# Patient Record
Sex: Male | Born: 1990 | Race: White | Hispanic: No | Marital: Single | Smoking: Former smoker
Health system: Southern US, Community
[De-identification: ages and names within clinical notes are randomized; demographics above are authoritative.]

## PROBLEM LIST (undated history)

## (undated) DIAGNOSIS — F419 Anxiety disorder, unspecified: Secondary | ICD-10-CM

## (undated) DIAGNOSIS — A630 Anogenital (venereal) warts: Secondary | ICD-10-CM

## (undated) DIAGNOSIS — B019 Varicella without complication: Secondary | ICD-10-CM

## (undated) HISTORY — DX: Anxiety disorder, unspecified: F41.9

## (undated) HISTORY — DX: Anogenital (venereal) warts: A63.0

## (undated) HISTORY — DX: Varicella without complication: B01.9

---
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
## 2010-12-07 ENCOUNTER — Ambulatory Visit (INDEPENDENT_AMBULATORY_CARE_PROVIDER_SITE_OTHER): Payer: Self-pay | Admitting: Family Medicine

## 2010-12-07 ENCOUNTER — Encounter: Payer: Self-pay | Admitting: Family Medicine

## 2010-12-07 VITALS — BP 108/68 | Temp 97.9°F | Ht 71.25 in | Wt 134.0 lb

## 2010-12-07 DIAGNOSIS — F419 Anxiety disorder, unspecified: Secondary | ICD-10-CM

## 2010-12-07 DIAGNOSIS — F411 Generalized anxiety disorder: Secondary | ICD-10-CM

## 2010-12-07 MED ORDER — SERTRALINE HCL 50 MG PO TABS: 50.0000 mg | ORAL_TABLET | Freq: Every day | ORAL | Status: DC

## 2010-12-07 NOTE — Progress Notes (Signed)
  Subjective:    Patient ID: Jeffrey Levine, male    DOB: [birth year], 20 y.o.   MRN: [id number]  HPI Here asking for help with anxiety. This has bothered him for the past year, but it is getting worse. He feels stressed about life in general, though he denies any specific problems in his life right now. He worries about things and has trouble relaxing. His sleep is intact. He denies any depression symptoms. He was taking taking classes at [employer] but stopped. Now he is trying to get a job at [employer].  Review of Systems  Constitutional: Negative.   Respiratory: Negative.   Cardiovascular: Negative.   Psychiatric/Behavioral: Positive for agitation. Negative for suicidal ideas, hallucinations, behavioral problems, confusion, sleep disturbance, self-injury, dysphoric mood and decreased concentration. The patient is nervous/anxious. The patient is not hyperactive.        Objective:   Physical Exam  Constitutional: He is oriented to person, place, and time. He appears well-developed and well-nourished.  Neurological: He is alert and oriented to person, place, and time.  Psychiatric: He has a normal mood and affect. His behavior is normal. Judgment and thought content normal.          Assessment & Plan:  Try Zoloft. I advised getting moe exercise.

## 2010-12-11 ENCOUNTER — Ambulatory Visit: Payer: Self-pay | Admitting: Family Medicine

## 2011-01-08 ENCOUNTER — Ambulatory Visit (INDEPENDENT_AMBULATORY_CARE_PROVIDER_SITE_OTHER): Payer: Self-pay | Admitting: Family Medicine

## 2011-01-08 ENCOUNTER — Encounter: Payer: Self-pay | Admitting: Family Medicine

## 2011-01-08 VITALS — BP 100/60 | HR 50 | Wt 135.0 lb

## 2011-01-08 DIAGNOSIS — F419 Anxiety disorder, unspecified: Secondary | ICD-10-CM | POA: Insufficient documentation

## 2011-01-08 DIAGNOSIS — F411 Generalized anxiety disorder: Secondary | ICD-10-CM

## 2011-01-08 MED ORDER — LORAZEPAM 0.5 MG PO TABS: 0.5000 mg | ORAL_TABLET | Freq: Three times a day (TID) | ORAL | Status: DC | PRN

## 2011-01-08 NOTE — Progress Notes (Signed)
  Subjective:    Patient ID: Jeffrey Levine, male    DOB: November 01, 1990, 20 y.o.   MRN: [id number]  HPI Here to follow up on anxiety. He tried Zoloft for about 3 weeks but had to stop it due to upset stomach and insomnia. He asks now to try something that he does not have to take on a daily basis. He got a job at the [employer] and is very pleased with this.    Review of Systems  Constitutional: Negative.   Psychiatric/Behavioral: Negative for dysphoric mood and agitation. The patient is nervous/anxious.        Objective:   Physical Exam  Constitutional: He appears well-developed and well-nourished.  Psychiatric: He has a normal mood and affect. His behavior is normal. Thought content normal.          Assessment & Plan:  Try Lorazepam instead

## 2011-02-28 ENCOUNTER — Encounter: Payer: Self-pay | Admitting: Family Medicine

## 2011-02-28 ENCOUNTER — Ambulatory Visit (INDEPENDENT_AMBULATORY_CARE_PROVIDER_SITE_OTHER): Payer: Self-pay | Admitting: Family Medicine

## 2011-02-28 VITALS — BP 100/66 | HR 58 | Temp 98.7°F | Wt 132.0 lb

## 2011-02-28 DIAGNOSIS — F411 Generalized anxiety disorder: Secondary | ICD-10-CM

## 2011-02-28 MED ORDER — DIAZEPAM 5 MG PO TABS: 5.0000 mg | ORAL_TABLET | Freq: Three times a day (TID) | ORAL | Status: AC | PRN

## 2011-02-28 NOTE — Progress Notes (Signed)
  Subjective:    Patient ID: Jeffrey Levine, male    DOB: Jun 16, 1991, 20 y.o.   MRN: [id number]  HPI  Here to follow up on anxiety. He has been using Lorazepam and this has been helpful. He feels more relaxed with it, but he has to take 2 or 3 tablets at a time for it to help. Also it wears off quickly, often in only an hour or two.   Review of Systems  Constitutional: Negative.   Psychiatric/Behavioral: The patient is nervous/anxious.        Objective:   Physical Exam  Constitutional: He appears well-developed and well-nourished.  Psychiatric: He has a normal mood and affect. His behavior is normal. Judgment and thought content normal.          Assessment & Plan:  We will switch to Valium for better efficacy and for a longer duration of effects.

## 2011-05-01 LAB — CBC
Hemoglobin: 12.4
MCHC: 34.1
MCHC: 35
MCV: 88.5
MCV: 90.5
Platelets: 187
Platelets: 212
RBC: 4.03
WBC: 15.4 — ABNORMAL HIGH
WBC: 15.6 — ABNORMAL HIGH

## 2011-05-01 LAB — I-STAT 8, (EC8 V) (CONVERTED LAB)
Chloride: 105
Glucose, Bld: 98
HCT: 45
Operator id: 294341
Potassium: 3.4 — ABNORMAL LOW

## 2011-05-01 LAB — BASIC METABOLIC PANEL
BUN: 9
Calcium: 9
Creatinine, Ser: 0.67
Glucose, Bld: 128 — ABNORMAL HIGH

## 2011-05-01 LAB — POCT I-STAT CREATININE: Creatinine, Ser: 1

## 2011-05-01 LAB — SAMPLE TO BLOOD BANK

## 2011-05-01 LAB — PROTIME-INR
INR: 1.1
Prothrombin Time: 14

## 2011-05-07 ENCOUNTER — Encounter: Payer: Self-pay | Admitting: Family Medicine

## 2011-05-07 ENCOUNTER — Ambulatory Visit (INDEPENDENT_AMBULATORY_CARE_PROVIDER_SITE_OTHER): Payer: 59 | Admitting: Family Medicine

## 2011-05-07 VITALS — BP 108/70 | HR 74 | Temp 97.8°F | Wt 137.0 lb

## 2011-05-07 DIAGNOSIS — F411 Generalized anxiety disorder: Secondary | ICD-10-CM

## 2011-05-07 DIAGNOSIS — F419 Anxiety disorder, unspecified: Secondary | ICD-10-CM

## 2011-05-07 MED ORDER — DIAZEPAM 10 MG PO TABS: 10.0000 mg | ORAL_TABLET | Freq: Two times a day (BID) | ORAL | Status: AC | PRN

## 2011-05-07 NOTE — Progress Notes (Signed)
  Subjective:    Patient ID: Jeffrey Levine, male    DOB: [birth year], 20 y.o.   MRN: [id number]  HPI Here to follow up on anxiety. He definitely likes Valium over Ativan, and he says to works much better. However it does not last long. He has started to take 2 at a time, and this works quite well for him. He is more relaxed and focused at work.    Review of Systems  Constitutional: Negative.   Psychiatric/Behavioral: Negative for dysphoric mood and decreased concentration. The patient is nervous/anxious.        Objective:   Physical Exam  Constitutional: He appears well-developed and well-nourished.  Psychiatric: He has a normal mood and affect. His behavior is normal. Thought content normal.          Assessment & Plan:  Increase the Valium to 10 mg bid

## 2011-10-10 ENCOUNTER — Ambulatory Visit (INDEPENDENT_AMBULATORY_CARE_PROVIDER_SITE_OTHER): Payer: 59 | Admitting: Family Medicine

## 2011-10-10 ENCOUNTER — Encounter: Payer: Self-pay | Admitting: Family Medicine

## 2011-10-10 VITALS — BP 102/78 | HR 65 | Temp 97.7°F | Wt 144.0 lb

## 2011-10-10 DIAGNOSIS — F419 Anxiety disorder, unspecified: Secondary | ICD-10-CM

## 2011-10-10 DIAGNOSIS — F411 Generalized anxiety disorder: Secondary | ICD-10-CM

## 2011-10-10 MED ORDER — ESCITALOPRAM OXALATE 10 MG PO TABS: 10.0000 mg | ORAL_TABLET | Freq: Every day | ORAL | Status: AC

## 2011-10-10 MED ORDER — DIAZEPAM 10 MG PO TABS: 10.0000 mg | ORAL_TABLET | Freq: Two times a day (BID) | ORAL | Status: AC | PRN

## 2011-10-10 NOTE — Progress Notes (Signed)
  Subjective:    Patient ID: Jeffrey Levine, male    DOB: 1991-02-27, 21 y.o.   MRN: [id number]  HPI Here to follow up on anxiety. He has been taking Valium and is pleased with how it works, however he seems to crash in between doses and feels very anxious. He sleeps well.    Review of Systems  Constitutional: Negative.   Psychiatric/Behavioral: Negative for behavioral problems, confusion, dysphoric mood, decreased concentration and agitation. The patient is nervous/anxious.        Objective:   Physical Exam  Constitutional: He appears well-developed and well-nourished.  Psychiatric: He has a normal mood and affect. His behavior is normal. Thought content normal.          Assessment & Plan:  We will add Lexapro to the Valium. Recheck one month

## 2012-05-10 ENCOUNTER — Other Ambulatory Visit: Payer: Self-pay | Admitting: Family Medicine

## 2012-05-12 NOTE — Telephone Encounter (Signed)
Call in #[phone] with 5 rf 

## 2012-11-26 ENCOUNTER — Encounter: Payer: Self-pay | Admitting: Family Medicine

## 2012-11-26 ENCOUNTER — Ambulatory Visit (INDEPENDENT_AMBULATORY_CARE_PROVIDER_SITE_OTHER): Payer: BC Managed Care – PPO | Admitting: Family Medicine

## 2012-11-26 VITALS — BP 102/68 | HR 60 | Temp 98.1°F | Wt 141.0 lb

## 2012-11-26 DIAGNOSIS — K219 Gastro-esophageal reflux disease without esophagitis: Secondary | ICD-10-CM

## 2012-11-26 NOTE — Progress Notes (Signed)
  Subjective:    Patient ID: Jeffrey Levine, male    DOB: [birth year], 22 y.o.   MRN: [id number]  HPI Here with 2 months of frequent indigestion, belching, and epigastric pain. No trouble swallowing. No nausea. BMs are normal. This started about the time that he started using a lot of high protein drinks and shakes in an attempt to gain weight.    Review of Systems  Constitutional: Negative.   Respiratory: Negative.   Cardiovascular: Negative.   Gastrointestinal: Positive for abdominal pain and abdominal distention. Negative for nausea, vomiting, diarrhea, constipation and blood in stool.       Objective:   Physical Exam  Constitutional: He appears well-developed and well-nourished.  Cardiovascular: Normal rate, regular rhythm, normal heart sounds and intact distal pulses.   Pulmonary/Chest: Effort normal and breath sounds normal.  Abdominal: Soft. Bowel sounds are normal. He exhibits no distension and no mass. There is no tenderness. There is no rebound and no guarding.          Assessment & Plan:  This is GERD which is probably related to his high protein intake. Advised to cut down on the drinks and shakes. Try Prilosec OTC daily

## 2013-04-30 ENCOUNTER — Encounter: Payer: Self-pay | Admitting: Family Medicine

## 2013-04-30 ENCOUNTER — Ambulatory Visit (INDEPENDENT_AMBULATORY_CARE_PROVIDER_SITE_OTHER): Payer: BC Managed Care – PPO | Admitting: Family Medicine

## 2013-04-30 VITALS — BP 108/70 | HR 77 | Temp 98.3°F | Wt 140.0 lb

## 2013-04-30 DIAGNOSIS — B977 Papillomavirus as the cause of diseases classified elsewhere: Secondary | ICD-10-CM

## 2013-04-30 DIAGNOSIS — Z23 Encounter for immunization: Secondary | ICD-10-CM

## 2013-04-30 MED ORDER — IMIQUIMOD 5 % EX CREA: TOPICAL_CREAM | CUTANEOUS | Status: DC

## 2013-04-30 NOTE — Progress Notes (Signed)
  Subjective:    Patient ID: Jeffrey Levine, male    DOB: [birth year], 22 y.o.   MRN: [id number]  HPI Patient here to discuss treatment for genital warts. These were diagnosed sometime in the past. He has previously used podofilox but had excessive drying and this never seemed to eradicate his warts. He has not tried any other topicals. He denies any other history of STD. He is also requesting HPV vaccine that we explained this is more for prevention and not treatment He is not currently sexually active.  Past Medical History  Diagnosis Date  . Chicken pox   . Anxiety    No past surgical history on file.  reports that he has been smoking Cigarettes.  He has been smoking about 0.00 packs per day. He has never used smokeless tobacco. He reports that he drinks alcohol. He reports that he does not use illicit drugs. family history includes Alcohol abuse in his other; Arthritis in his other; Diabetes in his other; Hypertension in his other; Stroke in his other. No Known Allergies    Review of Systems  Constitutional: Negative for fever and chills.  Genitourinary: Negative for dysuria, discharge and penile pain.  Hematological: Negative for adenopathy.       Objective:   Physical Exam  Constitutional: He appears well-developed and well-nourished.  Cardiovascular: Normal rate and regular rhythm.   Skin:  Patient has some typical condylomatous lesions scattered on the shaft of the penis. He has about 7 altogether in number          Assessment & Plan:  Genital condyloma. Discussed options. We'll try Aldara. Reviewed possible side effects and proper use. He is aware condyloma are difficult to eradicate.  Patient requesting HPV vaccine. We again reiterated this is not for treatment but instead for prevention. After reviewing risks benefits he still prefers going ahead with treatment.

## 2013-04-30 NOTE — Patient Instructions (Addendum)
Genital Warts Genital warts are a sexually transmitted infection. They may appear as small bumps on the tissues of the genital area. CAUSES  Genital warts are caused by a virus called human papillomavirus (HPV). HPV is the most common sexually transmitted disease (STD) and infection of the sex organs. This infection is spread by having unprotected sex with an infected person. It can be spread by vaginal, anal, and oral sex. Many people do not know they are infected. They may be infected for years without problems. However, even if they do not have problems, they can unknowingly pass the infection to their sexual partners. SYMPTOMS   Itching and irritation in the genital area.  Warts that bleed.  Painful sexual intercourse. DIAGNOSIS  Warts are usually recognized with the naked eye on the vagina, vulva, perineum, anus, and rectum. Certain tests can also diagnose genital warts, such as:  A Pap test.  A tissue sample (biopsy) exam.  Colposcopy. A magnifying tool is used to examine the vagina and cervix. The HPV cells will change color when certain solutions are used. TREATMENT  Warts can be removed by:  Applying certain chemicals, such as cantharidin or podophyllin.  Liquid nitrogen freezing (cryotherapy).  Immunotherapy with candida or trichophyton injections.  Laser treatment.  Burning with an electrified probe (electrocautery).  Interferon injections.  Surgery. PREVENTION  HPV vaccination can help prevent HPV infections that cause genital warts and that cause cancer of the cervix. It is recommended that the vaccination be given to people between the ages 9 to 26 years old. The vaccine might not work as well or might not work at all if you already have HPV. It should not be given to pregnant women. HOME CARE INSTRUCTIONS   It is important to follow your caregiver's instructions. The warts will not go away without treatment. Repeat treatments are often needed to get rid of warts.  Even after it appears that the warts are gone, the normal tissue underneath often remains infected.  Do not try to treat genital warts with medicine used to treat hand warts. This type of medicine is strong and can burn the skin in the genital area, causing more damage.  Tell your past and current sexual partner(s) that you have genital warts. They may be infected also and need treatment.  Avoid sexual contact while being treated.  Do not touch or scratch the warts. The infection may spread to other parts of your body.  Women with genital warts should have a cervical cancer check (Pap test) at least once a year. This type of cancer is slow-growing and can be cured if found early. Chances of developing cervical cancer are increased with HPV.  Inform your obstetrician about your warts in the event of pregnancy. This virus can be passed to the baby's respiratory tract. Discuss this with your caregiver.  Use a condom during sexual intercourse. Following treatment, the use of condoms will help prevent reinfection.  Ask your caregiver about using over-the-counter anti-itch creams. SEEK MEDICAL CARE IF:   Your treated skin becomes red, swollen, or painful.  You have a fever.  You feel generally ill.  You feel little lumps in and around your genital area.  You are bleeding or have painful sexual intercourse. MAKE SURE YOU:   Understand these instructions.  Will watch your condition.  Will get help right away if you are not doing well or get worse. Document Released: 07/06/2000 Document Revised: 10/01/2011 Document Reviewed: 01/15/2011 Exit[employer] Patient Information 2014 [employer].    Do not use Aldara beyond 16 weeks continuous use.

## 2013-07-24 ENCOUNTER — Encounter: Payer: Self-pay | Admitting: Family Medicine

## 2013-07-24 ENCOUNTER — Ambulatory Visit (INDEPENDENT_AMBULATORY_CARE_PROVIDER_SITE_OTHER): Payer: No Typology Code available for payment source | Admitting: Family Medicine

## 2013-07-24 VITALS — BP 100/66 | HR 66 | Temp 98.1°F | Wt 145.0 lb

## 2013-07-24 DIAGNOSIS — A630 Anogenital (venereal) warts: Secondary | ICD-10-CM

## 2013-07-24 NOTE — Progress Notes (Signed)
   Subjective:    Patient ID: Jeffrey Levine, male    DOB: [birth year], 23 y.o.   MRN: [id number]  HPI Here to treat genital warts that have been present for about one year. He had seen other doctors and had tried Condylox and Aldara creams. These were not successful and he stopped them due to intense irritation.    Review of Systems  Constitutional: Negative.        Objective:   Physical Exam  Constitutional: He appears well-developed.  Genitourinary:  Multiple flat or papular warts along the mons pubis and the penile shaft          Assessment & Plan:  A total of 15-20 warts were treated with cryosurgery. He will cover the area with Vaseline for the next week until the blisters subside. Recheck prn

## 2013-07-24 NOTE — Progress Notes (Signed)
Pre visit review using our clinic review tool, if applicable. No additional management support is needed unless otherwise documented below in the visit note. 

## 2013-07-27 DIAGNOSIS — A630 Anogenital (venereal) warts: Secondary | ICD-10-CM | POA: Insufficient documentation

## 2015-11-25 ENCOUNTER — Encounter: Payer: Self-pay | Admitting: Family Medicine

## 2015-11-25 ENCOUNTER — Ambulatory Visit (INDEPENDENT_AMBULATORY_CARE_PROVIDER_SITE_OTHER): Payer: 59 | Admitting: Family Medicine

## 2015-11-25 VITALS — BP 101/65 | HR 50 | Temp 98.3°F | Ht 71.25 in | Wt 157.0 lb

## 2015-11-25 DIAGNOSIS — S39012A Strain of muscle, fascia and tendon of lower back, initial encounter: Secondary | ICD-10-CM

## 2015-11-25 MED ORDER — DICLOFENAC SODIUM 75 MG PO TBEC: 75.0000 mg | DELAYED_RELEASE_TABLET | Freq: Two times a day (BID) | ORAL | Status: DC | PRN

## 2015-11-25 MED ORDER — CYCLOBENZAPRINE HCL 10 MG PO TABS: 10.0000 mg | ORAL_TABLET | Freq: Three times a day (TID) | ORAL | Status: DC | PRN

## 2015-11-25 NOTE — Progress Notes (Signed)
Pre visit review using our clinic review tool, if applicable. No additional management support is needed unless otherwise documented below in the visit note. 
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
## 2017-08-19 ENCOUNTER — Encounter: Payer: Self-pay | Admitting: Family Medicine

## 2017-08-19 ENCOUNTER — Ambulatory Visit (INDEPENDENT_AMBULATORY_CARE_PROVIDER_SITE_OTHER): Payer: 59 | Admitting: Family Medicine

## 2017-08-19 VITALS — BP 120/80 | HR 83 | Temp 98.0°F | Wt 152.8 lb

## 2017-08-19 DIAGNOSIS — B079 Viral wart, unspecified: Secondary | ICD-10-CM

## 2017-08-19 NOTE — Progress Notes (Signed)
   Subjective:    Patient ID: Jeffrey Levine, male    DOB: [birth year], 27 y.o.   MRN: [id number]  HPI Here to treat some warts that have been present for several years. He has a few small ones in the pubic area and a cluster on larger ones on the left 4th finger.    Review of Systems  Constitutional: Negative.   Respiratory: Negative.   Cardiovascular: Negative.        Objective:   Physical Exam  Constitutional: He appears well-developed and well-nourished.  Cardiovascular: Normal rate, regular rhythm, normal heart sounds and intact distal pulses.  Pulmonary/Chest: Effort normal and breath sounds normal. No respiratory distress. He has no wheezes. He has no rales.  Skin:  3 large warts on the left 4th finger. 3 smaller warts on the pubis.           Assessment & Plan:  Warts, all these were treated with several cycles of cryotherapy. Recheck prn. Gershon CraneStephen Aadyn Buchheit, MD

## 2017-12-06 ENCOUNTER — Ambulatory Visit: Payer: 59 | Admitting: Adult Health

## 2017-12-06 ENCOUNTER — Encounter: Payer: Self-pay | Admitting: Adult Health

## 2017-12-06 VITALS — BP 106/70 | Temp 98.4°F | Wt 159.0 lb

## 2017-12-06 DIAGNOSIS — N50812 Left testicular pain: Secondary | ICD-10-CM

## 2017-12-06 NOTE — Progress Notes (Signed)
Subjective:    Patient ID: Jeffrey Levine, male    DOB: 1991/02/20, 27 y.o.   MRN: [id number]  HPI  27 year old male who  has a past medical history of Anxiety, Chicken pox, and Genital warts. He is a patient of Dr. Clent Ridges who I am seeing today for an acute issue of left testicle pain x 2-3 days. Pain was worse yesterday and has started to resolve today. Pain worse when sitting.   He has not noticed any masses. Denies any trauma or concern for STI.   Review of Systems See HPI   Past Medical History:  Diagnosis Date  . Anxiety   . Chicken pox   . Genital warts     Social History   Socioeconomic History  . Marital status: Single    Spouse name: Not on file  . Number of children: Not on file  . Years of education: Not on file  . Highest education level: Not on file  Occupational History  . Not on file  Social Needs  . Financial resource strain: Not on file  . Food insecurity:    Worry: Not on file    Inability: Not on file  . Transportation needs:    Medical: Not on file    Non-medical: Not on file  Tobacco Use  . Smoking status: Former Smoker    Types: Cigarettes    Last attempt to quit: 04/23/2013    Years since quitting: 4.6  . Smokeless tobacco: Never Used  . Tobacco comment: 6 cigarettes each day  Substance and Sexual Activity  . Alcohol use: Yes    Alcohol/week: 0.0 oz    Comment: beers on the weekend  . Drug use: No  . Sexual activity: Not on file  Lifestyle  . Physical activity:    Days per week: Not on file    Minutes per session: Not on file  . Stress: Not on file  Relationships  . Social connections:    Talks on phone: Not on file    Gets together: Not on file    Attends religious service: Not on file    Active member of club or organization: Not on file    Attends meetings of clubs or organizations: Not on file    Relationship status: Not on file  . Intimate partner violence:    Fear of current or ex partner: Not on file    Emotionally  abused: Not on file    Physically abused: Not on file    Forced sexual activity: Not on file  Other Topics Concern  . Not on file  Social History Narrative  . Not on file    History reviewed. No pertinent surgical history.  Family History  Problem Relation Age of Onset  . Alcohol abuse Other   . Arthritis Other   . Diabetes Other   . Hypertension Other   . Stroke Other     No Known Allergies  No current outpatient medications on file prior to visit.   No current facility-administered medications on file prior to visit.     BP 106/70   Temp 98.4 F (36.9 C) (Oral)   Wt 159 lb (72.1 kg)   BMI 22.02 kg/m       Objective:   Physical Exam  Constitutional: He appears well-developed and well-nourished.  Abdominal: Hernia confirmed negative in the left inguinal area.  Genitourinary: Penis normal. Right testis shows no mass, no swelling and no tenderness. Right testis  is descended. Cremasteric reflex is not absent on the right side. Left testis shows tenderness. Left testis shows no mass and no swelling. Left testis is descended. Cremasteric reflex is not absent on the left side.  Genitourinary Comments: Variocele present    Lymphadenopathy: No inguinal adenopathy noted on the left side.  Skin: He is not diaphoretic.  Nursing note and vitals reviewed.     Assessment & Plan:  1. Pain in left testicle - Variocele noted.  - Will get [place] due to age of patient  - No torsion or mass noted.  - Advised motrin or tylenol for symptom relief.  - Return precautions given   Shirline Frees, NP

## 2018-01-01 ENCOUNTER — Telehealth: Payer: Self-pay | Admitting: *Deleted

## 2018-01-01 ENCOUNTER — Other Ambulatory Visit: Payer: Self-pay | Admitting: Adult Health

## 2018-01-01 DIAGNOSIS — N50819 Testicular pain, unspecified: Secondary | ICD-10-CM

## 2018-01-01 NOTE — Telephone Encounter (Signed)
Order changed.

## 2018-01-01 NOTE — Telephone Encounter (Signed)
Copied from CRM [id number]. Topic: Quick Communication - See Telephone Encounter >> Jan 01, 2018 11:36 AM Waymon AmatoBurton, Donna F wrote: [facility] is calling to get a different order for pt scrotum ultrasound the order needs to say ultrasound with doppler   [facility] [phone]

## 2018-01-01 NOTE — Telephone Encounter (Signed)
Sent to PCP. Please advise.  

## 2018-01-01 NOTE — Telephone Encounter (Signed)
Since Jeffrey Levine wrote the original order and examined him. I will let [name] change the order

## 2018-01-02 ENCOUNTER — Other Ambulatory Visit: Payer: Self-pay | Admitting: Family Medicine

## 2018-01-02 DIAGNOSIS — N5082 Scrotal pain: Secondary | ICD-10-CM

## 2018-01-02 NOTE — Telephone Encounter (Signed)
I just need it for pain.   Are the orders I put in yesterday not correct?

## 2018-01-02 NOTE — Telephone Encounter (Signed)
Spoke to [place] and informed her that [name] has changed order.  She states it is still incorrect.  I changed it to [id number].  No further action required.

## 2018-01-02 NOTE — Telephone Encounter (Signed)
If trying to rule out torsion, needs to be orders stat and done at hospital. If not needs to be US scotum with doppler if just for pain. Please advise

## 2018-04-11 ENCOUNTER — Ambulatory Visit: Payer: 59 | Admitting: Family Medicine

## 2018-04-11 ENCOUNTER — Encounter: Payer: Self-pay | Admitting: Family Medicine

## 2018-04-11 VITALS — BP 110/84 | HR 78 | Temp 98.4°F | Wt 154.1 lb

## 2018-04-11 DIAGNOSIS — J019 Acute sinusitis, unspecified: Secondary | ICD-10-CM

## 2018-04-11 MED ORDER — AZITHROMYCIN 250 MG PO TABS: ORAL_TABLET | ORAL | 0 refills | Status: DC

## 2018-04-11 NOTE — Progress Notes (Signed)
   Subjective:    Patient ID: Jeffrey Levine, male    DOB: [birth year], 27 y.o.   MRN: [id number]  HPI Here for 5 days of sinus pressure, PND, ST, and a dry cough. On Mucinex.    Review of Systems  Constitutional: Negative.   HENT: Positive for congestion, postnasal drip, sinus pressure and sore throat. Negative for sinus pain.   Eyes: Negative.   Respiratory: Positive for cough.        Objective:   Physical Exam  Constitutional: He appears well-developed and well-nourished.  HENT:  Right Ear: External ear normal.  Left Ear: External ear normal.  Nose: Nose normal.  Mouth/Throat: Oropharynx is clear and moist.  Eyes: Conjunctivae are normal.  Neck: No thyromegaly present.  Pulmonary/Chest: Effort normal and breath sounds normal. No stridor. No respiratory distress. He has no wheezes. He has no rales.  Lymphadenopathy:    He has no cervical adenopathy.          Assessment & Plan:  Sinusitis, treat with a Zpack.  Gershon CraneStephen Aliannah Holstrom, MD

## 2018-04-16 ENCOUNTER — Ambulatory Visit: Payer: 59 | Admitting: Family Medicine

## 2018-04-16 ENCOUNTER — Encounter: Payer: Self-pay | Admitting: Family Medicine

## 2018-04-16 VITALS — BP 110/72 | HR 58 | Temp 97.7°F | Wt 155.2 lb

## 2018-04-16 DIAGNOSIS — L409 Psoriasis, unspecified: Secondary | ICD-10-CM | POA: Diagnosis not present

## 2018-04-16 DIAGNOSIS — A630 Anogenital (venereal) warts: Secondary | ICD-10-CM

## 2018-04-16 DIAGNOSIS — Z209 Contact with and (suspected) exposure to unspecified communicable disease: Secondary | ICD-10-CM

## 2018-04-16 MED ORDER — IMIQUIMOD 5 % EX CREA: TOPICAL_CREAM | CUTANEOUS | 5 refills | Status: DC

## 2018-04-16 MED ORDER — HALOBETASOL PROPIONATE 0.05 % EX CREA: TOPICAL_CREAM | Freq: Two times a day (BID) | CUTANEOUS | 5 refills | Status: DC | PRN

## 2018-04-16 NOTE — Progress Notes (Signed)
   Subjective:    Patient ID: Jeffrey Levine, male    DOB: [birth year], 27 y.o.   MRN: [id number]  HPI Here for several issues. First he thinks some genital warts have come back on his scrotum. We had treated a few of these with cryotherapy earlier this year. Also his psoriasis has gotten a little worse, and the Triamcinolone cream has not been very effective.    Review of Systems  Constitutional: Negative.   Respiratory: Negative.   Cardiovascular: Negative.   Skin: Positive for rash.       Objective:   Physical Exam  Constitutional: He appears well-developed and well-nourished.  Cardiovascular: Normal rate, regular rhythm, normal heart sounds and intact distal pulses.  Pulmonary/Chest: Effort normal and breath sounds normal.  Genitourinary:  Genitourinary Comments: There are 3 pink tiny papular lesions on the scrotum  Skin:  There are thick scaly plaques over the extensor surfaces of both elbows           Assessment & Plan:  Recurrent genital warts, he will try Aldara cream as needed. Treat the psoriasis with Halobetasol cream. We will do some additional STD testing today. Gershon Crane, MD

## 2018-04-17 LAB — C. TRACHOMATIS/N. GONORRHOEAE RNA
C. TRACHOMATIS RNA, TMA: NOT DETECTED
N. GONORRHOEAE RNA, TMA: NOT DETECTED

## 2018-04-17 LAB — HIV ANTIBODY (ROUTINE TESTING W REFLEX): HIV 1&2 Ab, 4th Generation: NONREACTIVE

## 2018-04-17 LAB — RPR: RPR: NONREACTIVE

## 2018-09-11 ENCOUNTER — Telehealth: Payer: Self-pay

## 2018-09-11 NOTE — Telephone Encounter (Signed)
Copied from CRM [id number]. Topic: General - Other >> Sep 11, 2018  2:08 PM [name] wrote:  Pt Mom has shingles and is asking if he need to get the shingles vaccine

## 2018-09-12 NOTE — Telephone Encounter (Signed)
Attempted to call the pt but the VM is not set up.  Will call back later 

## 2018-09-12 NOTE — Telephone Encounter (Signed)
Dr. Fry please advise. Thanks  

## 2018-09-12 NOTE — Telephone Encounter (Signed)
No it is not recommended until the age of 36

## 2018-09-16 NOTE — Telephone Encounter (Signed)
Attempted to call the pt again but the VM is not set up.

## 2018-09-18 NOTE — Telephone Encounter (Signed)
Called and spoke with pt and he stated that he has never had the chicken pox and he stated that her doctor told him that he was at a very high risk of contracting it.  He would like to know what he needs to do about getting the shingles vaccine.

## 2018-09-19 NOTE — Telephone Encounter (Signed)
I would wait until he is 50 because (1) insurance probably will not cover it, (2) we do not know how safe it is at his age, and (3) we do not know how long the immunity would last if we give it too early

## 2018-10-06 ENCOUNTER — Ambulatory Visit: Payer: 59 | Admitting: Family Medicine

## 2018-10-10 ENCOUNTER — Ambulatory Visit: Payer: 59 | Admitting: Family Medicine

## 2018-12-30 ENCOUNTER — Telehealth: Payer: Self-pay | Admitting: *Deleted

## 2018-12-30 ENCOUNTER — Ambulatory Visit (INDEPENDENT_AMBULATORY_CARE_PROVIDER_SITE_OTHER): Payer: 59 | Admitting: Family Medicine

## 2018-12-30 ENCOUNTER — Other Ambulatory Visit: Payer: Self-pay

## 2018-12-30 ENCOUNTER — Encounter: Payer: Self-pay | Admitting: Family Medicine

## 2018-12-30 DIAGNOSIS — B349 Viral infection, unspecified: Secondary | ICD-10-CM | POA: Diagnosis not present

## 2018-12-30 DIAGNOSIS — Z20828 Contact with and (suspected) exposure to other viral communicable diseases: Secondary | ICD-10-CM | POA: Diagnosis not present

## 2018-12-30 NOTE — Progress Notes (Signed)
   Subjective:    Patient ID: Jeffrey Levine, male    DOB: [birth year], 28 y.o.   MRN: [id number]  HPI Virtual Visit via Video Note  I connected with the patient on 12/30/18 at  3:15 PM EDT by a video enabled telemedicine application and verified that I am speaking with the correct person using two identifiers.  Location patient: home Location provider:work or home office Persons participating in the virtual visit: patient, provider  I discussed the limitations of evaluation and management by telemedicine and the availability of in person appointments. The patient expressed understanding and agreed to proceed.   HPI: Here for 4 days of fever to 100.7 degrees, body aches, headache, ST, and diarrhea. No cough or SOB, no nausea or vomiting. hehas been drinking fluids and taking Nyquil or Dayquil. He went to the [facility] yesterday and he was tested for the Covid-19 virus. He is still waiting on results. He has been quarantining himself at home for several days.    ROS: See pertinent positives and negatives per HPI.  Past Medical History:  Diagnosis Date  . Anxiety   . Chicken pox   . Genital warts     History reviewed. No pertinent surgical history.  Family History  Problem Relation Age of Onset  . Alcohol abuse Other   . Arthritis Other   . Diabetes Other   . Hypertension Other   . Stroke Other      Current Outpatient Medications:  .  halobetasol (ULTRAVATE) 0.05 % cream, Apply topically 2 (two) times daily as needed. For psoriasis, Disp: 50 g, Rfl: 5 .  imiquimod (ALDARA) 5 % cream, Apply topically 3 (three) times a week., Disp: 24 each, Rfl: 5  EXAM:  VITALS per patient if applicable:  GENERAL: alert, oriented, appears well and in no acute distress  HEENT: atraumatic, conjunttiva clear, no obvious abnormalities on inspection of external nose and ears  NECK: normal movements of the head and neck  LUNGS: on inspection no signs of respiratory  distress, breathing rate appears normal, no obvious gross SOB, gasping or wheezing  CV: no obvious cyanosis  MS: moves all visible extremities without noticeable abnormality  PSYCH/NEURO: pleasant and cooperative, no obvious depression or anxiety, speech and thought processing grossly intact  ASSESSMENT AND PLAN: Viral illness. We will find out of this is from a Covid-19 infection or not. He will rest and drink fluids. He will stay home on quarantine. Try Tylenol for aches and fever.  Alysia Penna, MD  Discussed the following assessment and plan:  No diagnosis found.     I discussed the assessment and treatment plan with the patient. The patient was provided an opportunity to ask questions and all were answered. The patient agreed with the plan and demonstrated an understanding of the instructions.   The patient was advised to call back or seek an in-person evaluation if the symptoms worsen or if the condition fails to improve as anticipated.     Review of Systems     Objective:   Physical Exam        Assessment & Plan:

## 2018-12-30 NOTE — Telephone Encounter (Signed)
Dr. Sarajane Jews please advise if you want to wait for covid test to come back or do a VV with the pt?  Thanks

## 2018-12-30 NOTE — Telephone Encounter (Signed)
Patient called after hours line on 12/29/2018. Patient c/o fever of 100. Sore throat, congested, no smell or taste. Was tested for COVID, no results yet. Clinic RN does not see any pending results for COVID-19.

## 2018-12-30 NOTE — Telephone Encounter (Signed)
We can do a Doxy visit this afternoon

## 2018-12-30 NOTE — Telephone Encounter (Signed)
Doxy scheduled for today. 

## 2020-04-26 ENCOUNTER — Other Ambulatory Visit: Payer: Self-pay

## 2020-04-26 ENCOUNTER — Encounter: Payer: Self-pay | Admitting: Family Medicine

## 2020-04-26 ENCOUNTER — Ambulatory Visit (INDEPENDENT_AMBULATORY_CARE_PROVIDER_SITE_OTHER): Payer: Self-pay | Admitting: Family Medicine

## 2020-04-26 VITALS — BP 110/80 | HR 75 | Temp 98.8°F | Ht 72.0 in | Wt 154.8 lb

## 2020-04-26 DIAGNOSIS — R109 Unspecified abdominal pain: Secondary | ICD-10-CM

## 2020-04-26 DIAGNOSIS — N390 Urinary tract infection, site not specified: Secondary | ICD-10-CM

## 2020-04-26 MED ORDER — HALOBETASOL PROPIONATE 0.05 % EX CREA: TOPICAL_CREAM | Freq: Two times a day (BID) | CUTANEOUS | 5 refills | Status: DC | PRN

## 2020-04-26 MED ORDER — CIPROFLOXACIN HCL 500 MG PO TABS: 500.0000 mg | ORAL_TABLET | Freq: Two times a day (BID) | ORAL | 0 refills | Status: DC

## 2020-04-26 NOTE — Progress Notes (Signed)
   Subjective:    Patient ID: Jeffrey Levine, male    DOB: 08/06/1990, 29 y.o.   MRN: [id number]  HPI Here for 2 weeks of an intermittent sharp pain in the left flank. No recent trauma, but prior to the pain starting he had been working out at home doing sit ups, push ups, and planks. The pain was worst last week and now it is fading away. It has pain when he takes a deep breath, but he is fine when sitting still. No bowel problems. No fever or urinary urgency or burning. He does notice a "stong smell" to his urine at times.   Review of Systems  Constitutional: Negative.   Respiratory: Negative.   Cardiovascular: Negative.   Gastrointestinal: Negative.   Genitourinary: Positive for flank pain. Negative for dysuria, hematuria, testicular pain and urgency.       Objective:   Physical Exam Constitutional:      General: He is not in acute distress.    Appearance: Normal appearance.  Cardiovascular:     Rate and Rhythm: Normal rate and regular rhythm.     Pulses: Normal pulses.     Heart sounds: Normal heart sounds.  Pulmonary:     Effort: Pulmonary effort is normal.     Breath sounds: Normal breath sounds.     Comments: He is tender over the left lateral ribs near the inferior rib margin Abdominal:     General: Abdomen is flat. Bowel sounds are normal. There is no distension.     Palpations: Abdomen is soft. There is no mass.     Tenderness: There is no abdominal tenderness. There is no guarding or rebound.     Hernia: No hernia is present.  Neurological:     Mental Status: He is alert.           Assessment & Plan:  UTI, treat with Cipro for 10 days. He will drink plenty of water. Culture the sample.  Gershon Crane, MD

## 2020-04-27 ENCOUNTER — Ambulatory Visit: Payer: 59 | Admitting: Family Medicine

## 2020-04-27 NOTE — Addendum Note (Signed)
Addended by: Lerry Liner on: 04/27/2020 02:02 PM   Modules accepted: Orders

## 2020-04-28 LAB — URINE CULTURE
MICRO NUMBER:: 11039105
Result:: NO GROWTH
SPECIMEN QUALITY:: ADEQUATE

## 2020-05-02 ENCOUNTER — Telehealth: Payer: Self-pay

## 2020-05-02 NOTE — Telephone Encounter (Signed)
Since he has ruled out for a kidney infection ,it now seems likely this is a muscular pain as we discussed. Call in Ibuprofen 800 mg to take one every 6 hours prn pain, #60 with no refills. If this does not work he should come back in for another exam.

## 2020-05-02 NOTE — Telephone Encounter (Signed)
Patient called and advised of Dr. Claris Che recommendation below and to stop taking the Cipro. He says he doesn't want the Ibuprofen called in that if he doesn't get better, he will call in for another visit.

## 2020-05-02 NOTE — Telephone Encounter (Signed)
Patient called asking for his urine culture results. Given results negative for growth as noted by Dr. Clent Ridges on 04/29/20. He says he is still not feeling any better on day 5 of the antibiotics and wonders if something else is going on. I advised that I will let Dr. Clent Ridges know and will call back with his recommendation.

## 2020-05-06 NOTE — Telephone Encounter (Signed)
He should drink lots and lots of water and maybe add some cranberry juice to it

## 2020-05-06 NOTE — Telephone Encounter (Signed)
Patient called to ask Dr. Clent Ridges why his urine still has an odor and should he be seen again or what should he be doing or not doing to help with the odor of his urine.  Please advise

## 2020-05-09 NOTE — Telephone Encounter (Signed)
Patient called and given recommendations per Dr. Clent Ridges. He verbalized understanding. He says he drinks about 2-3 bottles/day of water, nothing else. I asked about the urine color, he says golden yellow. I advised to drink 64 oz water/day and call back in a few days if no changes to the urine. He verbalized understanding.

## 2021-01-03 ENCOUNTER — Ambulatory Visit: Payer: Self-pay | Admitting: Family Medicine

## 2021-01-03 ENCOUNTER — Other Ambulatory Visit: Payer: Self-pay

## 2021-01-04 ENCOUNTER — Ambulatory Visit: Payer: Self-pay | Admitting: Family Medicine

## 2021-01-05 ENCOUNTER — Encounter: Payer: Self-pay | Admitting: Family Medicine

## 2021-01-05 ENCOUNTER — Other Ambulatory Visit: Payer: Self-pay

## 2021-01-05 ENCOUNTER — Ambulatory Visit (INDEPENDENT_AMBULATORY_CARE_PROVIDER_SITE_OTHER): Payer: 59 | Admitting: Family Medicine

## 2021-01-05 VITALS — BP 108/78 | HR 68 | Temp 97.7°F | Wt 147.0 lb

## 2021-01-05 DIAGNOSIS — R8290 Unspecified abnormal findings in urine: Secondary | ICD-10-CM | POA: Diagnosis not present

## 2021-01-05 DIAGNOSIS — J3089 Other allergic rhinitis: Secondary | ICD-10-CM | POA: Diagnosis not present

## 2021-01-05 DIAGNOSIS — G629 Polyneuropathy, unspecified: Secondary | ICD-10-CM

## 2021-01-05 DIAGNOSIS — Z209 Contact with and (suspected) exposure to unspecified communicable disease: Secondary | ICD-10-CM | POA: Diagnosis not present

## 2021-01-05 LAB — LIPID PANEL
Cholesterol: 108 mg/dL (ref 0–200)
HDL: 55.9 mg/dL (ref >39.00)
LDL Cholesterol: 37 mg/dL (ref 0–99)
NonHDL: 52.26
Total CHOL/HDL Ratio: 2
Triglycerides: 74 mg/dL (ref 0.0–149.0)
VLDL: 14.8 mg/dL (ref 0.0–40.0)

## 2021-01-05 LAB — HEPATIC FUNCTION PANEL
ALT: 16 U/L (ref 0–53)
AST: 21 U/L (ref 0–37)
Albumin: 5.1 g/dL (ref 3.5–5.2)
Alkaline Phosphatase: 91 U/L (ref 39–117)
Bilirubin, Direct: 0.2 mg/dL (ref 0.0–0.3)
Total Bilirubin: 0.8 mg/dL (ref 0.2–1.2)
Total Protein: 7.9 g/dL (ref 6.0–8.3)

## 2021-01-05 LAB — BASIC METABOLIC PANEL
BUN: 10 mg/dL (ref 6–23)
CO2: 31 mEq/L (ref 19–32)
Calcium: 10.3 mg/dL (ref 8.4–10.5)
Chloride: 99 mEq/L (ref 96–112)
Creatinine, Ser: 0.79 mg/dL (ref 0.40–1.50)
GFR: 119.35 mL/min (ref >60.00)
Glucose, Bld: 76 mg/dL (ref 70–99)
Potassium: 4.3 mEq/L (ref 3.5–5.1)
Sodium: 136 mEq/L (ref 135–145)

## 2021-01-05 LAB — CBC WITH DIFFERENTIAL/PLATELET
Basophils Absolute: 0 K/uL (ref 0.0–0.1)
Basophils Relative: 0.4 % (ref 0.0–3.0)
Eosinophils Absolute: 0.1 K/uL (ref 0.0–0.7)
Eosinophils Relative: 0.7 % (ref 0.0–5.0)
HCT: 47.6 % (ref 39.0–52.0)
Hemoglobin: 16.4 g/dL (ref 13.0–17.0)
Lymphocytes Relative: 26.5 % (ref 12.0–46.0)
Lymphs Abs: 1.9 K/uL (ref 0.7–4.0)
MCHC: 34.5 g/dL (ref 30.0–36.0)
MCV: 93 fl (ref 78.0–100.0)
Monocytes Absolute: 0.6 K/uL (ref 0.1–1.0)
Monocytes Relative: 8.2 % (ref 3.0–12.0)
Neutro Abs: 4.6 K/uL (ref 1.4–7.7)
Neutrophils Relative %: 64.2 % (ref 43.0–77.0)
Platelets: 192 K/uL (ref 150.0–400.0)
RBC: 5.12 Mil/uL (ref 4.22–5.81)
RDW: 12.6 % (ref 11.5–15.5)
WBC: 7.2 K/uL (ref 4.0–10.5)

## 2021-01-05 LAB — VITAMIN B12: Vitamin B-12: 262 pg/mL (ref 211–911)

## 2021-01-05 LAB — POC URINALSYSI DIPSTICK (AUTOMATED)
Bilirubin, UA: NEGATIVE
Blood, UA: NEGATIVE
Glucose, UA: NEGATIVE
Ketones, UA: NEGATIVE
Leukocytes, UA: NEGATIVE
Nitrite, UA: NEGATIVE
Protein, UA: NEGATIVE
Spec Grav, UA: <=1.005 — AB (ref 1.010–1.025)
Urobilinogen, UA: 0.2 E.U./dL
pH, UA: 6.5 (ref 5.0–8.0)

## 2021-01-05 LAB — HEMOGLOBIN A1C: Hgb A1c MFr Bld: 5.4 % (ref 4.6–6.5)

## 2021-01-05 LAB — T4, FREE: Free T4: 0.72 ng/dL (ref 0.60–1.60)

## 2021-01-05 LAB — TSH: TSH: 0.53 u[IU]/mL (ref 0.35–4.50)

## 2021-01-05 LAB — T3, FREE: T3, Free: 3.2 pg/mL (ref 2.3–4.2)

## 2021-01-05 NOTE — Progress Notes (Signed)
   Subjective:    Patient ID: Jeffrey Levine, male    DOB: October 15, 1990, 30 y.o.   MRN: [id number]  HPI Here for several issues. First he has noticed a dark color and an odd odor to his urine off and on for several weeks. No urgency or burning. Also he has frequent cramping and loose stools. He has been told her has IBS in the past, but he wants to make sure he is not allergic to certain foods. Also he asks to be tested for STD's since he is concerned about some of his sexual contacts. He denies any STD symptoms. Finally for the past 2 years he has had spells where he feels numbness or tingling in the hands and feet. No pain or swelling.    Review of Systems  Constitutional: Negative.   Respiratory: Negative.    Cardiovascular: Negative.   Gastrointestinal:  Positive for abdominal pain. Negative for abdominal distention, anal bleeding, blood in stool, constipation, diarrhea and nausea.  Genitourinary:  Negative for difficulty urinating, dysuria, flank pain, frequency, hematuria and urgency.  Neurological:  Positive for numbness. Negative for weakness.      Objective:   Physical Exam Constitutional:      Appearance: Normal appearance.  Cardiovascular:     Rate and Rhythm: Normal rate and regular rhythm.     Pulses: Normal pulses.     Heart sounds: Normal heart sounds.  Pulmonary:     Effort: Pulmonary effort is normal.     Breath sounds: Normal breath sounds.  Abdominal:     General: Abdomen is flat. Bowel sounds are normal. There is no distension.     Palpations: Abdomen is soft. There is no mass.     Tenderness: There is no abdominal tenderness. There is no guarding or rebound.     Hernia: No hernia is present.  Skin:    General: Skin is warm and dry.  Neurological:     General: No focal deficit present.     Mental Status: He is alert. Mental status is at baseline.          Assessment & Plan:  He has a neuropathy, and we will investigate this with labs today. We will test  for STDs as above. His UA is clear, so he does not seem to have any urinary problems. We will refer him to Allergy to evaluate for any food allergies. We spent 35 minutes discussing these issues today. Gershon Crane, MD

## 2021-01-06 LAB — HIV ANTIBODY (ROUTINE TESTING W REFLEX): HIV 1&2 Ab, 4th Generation: NONREACTIVE

## 2021-01-06 LAB — RPR: RPR Ser Ql: NONREACTIVE

## 2021-01-06 LAB — C. TRACHOMATIS/N. GONORRHOEAE RNA
C. trachomatis RNA, TMA: NOT DETECTED
N. gonorrhoeae RNA, TMA: NOT DETECTED

## 2021-01-11 ENCOUNTER — Ambulatory Visit: Payer: Self-pay | Admitting: Family Medicine

## 2021-02-14 ENCOUNTER — Telehealth: Payer: Self-pay | Admitting: Family Medicine

## 2021-02-14 NOTE — Telephone Encounter (Signed)
Patient wants to know if Dr. Clent Ridges can call in an oral Rx for him for HPV instead of a cream because that's a little to use.  [id number] [place], [place] - [address] AT [facility] [address] Phone:  [phone]  Fax:  [phone]     Please advise

## 2021-02-15 NOTE — Telephone Encounter (Signed)
I am not aware of any oral medications for HPV. We can refer him to Dermatology if he wishes

## 2021-02-15 NOTE — Telephone Encounter (Signed)
Last office visit- 01/05/21

## 2021-02-16 NOTE — Telephone Encounter (Addendum)
Spoke with patient about message, he had many concerns, and questions.   The initial medication request was for Valtrex.    Patient is to call to schedule visit with provider to  answer questions.

## 2021-02-17 NOTE — Telephone Encounter (Signed)
He's welcome to make an appt but tell him that Valtrex treats Herpes simplex and zoster infections, not human papilloma virus

## 2021-02-17 NOTE — Telephone Encounter (Signed)
On 02/16/21, informed patient that Valtrex treat Herpes, not HPV.   At that time patient was confused if he should see a dermatologist, that's when patient was advised to schedule virtual visit with Dr. Clent Ridges to discuss all concerns.

## 2021-03-14 ENCOUNTER — Encounter: Payer: Self-pay | Admitting: Family Medicine

## 2021-03-14 ENCOUNTER — Telehealth (INDEPENDENT_AMBULATORY_CARE_PROVIDER_SITE_OTHER): Payer: 59 | Admitting: Family Medicine

## 2021-03-14 DIAGNOSIS — Z209 Contact with and (suspected) exposure to unspecified communicable disease: Secondary | ICD-10-CM

## 2021-03-14 MED ORDER — VALACYCLOVIR HCL 500 MG PO TABS: 500.0000 mg | ORAL_TABLET | Freq: Every day | ORAL | 5 refills | Status: DC

## 2021-03-14 NOTE — Progress Notes (Signed)
   Subjective:    Patient ID: Jeffrey Levine, male    DOB: 1990/11/07, 30 y.o.   MRN: [id number]  HPI Virtual Visit via Video Note  I connected with the patient on 03/14/21 at  4:00 PM EDT by a video enabled telemedicine application and verified that I am speaking with the correct person using two identifiers.  Location patient: home Location provider:work or home office Persons participating in the virtual visit: patient, provider  I discussed the limitations of evaluation and management by telemedicine and the availability of in person appointments. The patient expressed understanding and agreed to proceed.   HPI: Here to discuss his concerns about possible herpes virus infection. We have treated him a number of times over the past few years for genital warts, and in June we screened him for Chlamydia, gonorrhea, HIV, and syphills (all were negative). However he is concerned that some of the lesions he gets on his penis could be due herpes. He is in a sexual relationship and he wants to start taking Valtrex to prohibit any possible transmission to his partner.    ROS: See pertinent positives and negatives per HPI.  Past Medical History:  Diagnosis Date   Anxiety    Chicken pox    Genital warts     History reviewed. No pertinent surgical history.  Family History  Problem Relation Age of Onset   Alcohol abuse Other    Arthritis Other    Diabetes Other    Hypertension Other    Stroke Other      Current Outpatient Medications:    valACYclovir (VALTREX) 500 MG tablet, Take 1 tablet (500 mg total) by mouth daily., Disp: 30 tablet, Rfl: 5  EXAM:  VITALS per patient if applicable:  GENERAL: alert, oriented, appears well and in no acute distress  HEENT: atraumatic, conjunttiva clear, no obvious abnormalities on inspection of external nose and ears  NECK: normal movements of the head and neck  LUNGS: on inspection no signs of respiratory distress, breathing rate appears  normal, no obvious gross SOB, gasping or wheezing  CV: no obvious cyanosis  MS: moves all visible extremities without noticeable abnormality  PSYCH/NEURO: pleasant and cooperative, no obvious depression or anxiety, speech and thought processing grossly intact  ASSESSMENT AND PLAN: STD management. He come by the lab to check for antibodies to Herpes types 1 and 2. He will also start taking Valtrex 500 mg daily.  Gershon Crane, MD  Discussed the following assessment and plan:  Exposure to communicable disease - Plan: HSV(herpes simplex vrs) 1+2 ab-IgG     I discussed the assessment and treatment plan with the patient. The patient was provided an opportunity to ask questions and all were answered. The patient agreed with the plan and demonstrated an understanding of the instructions.   The patient was advised to call back or seek an in-person evaluation if the symptoms worsen or if the condition fails to improve as anticipated.      Review of Systems     Objective:   Physical Exam        Assessment & Plan:

## 2021-08-01 ENCOUNTER — Other Ambulatory Visit: Payer: Self-pay

## 2021-08-01 ENCOUNTER — Telehealth (INDEPENDENT_AMBULATORY_CARE_PROVIDER_SITE_OTHER): Payer: Self-pay | Admitting: Family Medicine

## 2021-08-01 ENCOUNTER — Encounter: Payer: Self-pay | Admitting: Family Medicine

## 2021-08-01 DIAGNOSIS — R220 Localized swelling, mass and lump, head: Secondary | ICD-10-CM

## 2021-08-01 MED ORDER — VALACYCLOVIR HCL 1 G PO TABS: 1000.0000 mg | ORAL_TABLET | Freq: Three times a day (TID) | ORAL | 0 refills | Status: DC

## 2021-08-01 NOTE — Progress Notes (Signed)
Subjective:    Patient ID: Jeffrey Levine, male    DOB: 1990/12/12, 31 y.o.   MRN: [id number]  HPI Virtual Visit via Video Note  I connected with the patient on 08/01/21 at 11:00 AM EST by a video enabled telemedicine application and verified that I am speaking with the correct person using two identifiers.  Location patient: home Location provider:work or home office Persons participating in the virtual visit: patient, provider  I discussed the limitations of evaluation and management by telemedicine and the availability of in person appointments. The patient expressed understanding and agreed to proceed.   HPI: Here for 6 days of painful swelling in the upper lip. There are no discrete lesions on the outside or the inside of the lip. No soreness in the tongue or the throat. Of note he began to have chills, fevers, fatigue, and head congestion 10 days ago, and 9 days ago he tested positive for the Covid-19 virus. Now all the Covid symptoms are gone. He has a hx of fever blisters. On 07-27-21 he went to an urgent care for the swollen lip, and they felt he was having an allergic reaction to something. They gave him a Medrol dose pack, which he has been taking with Benadryl. This has not helped at all.    ROS: See pertinent positives and negatives per HPI.  Past Medical History:  Diagnosis Date   Anxiety    Chicken pox    Genital warts     History reviewed. No pertinent surgical history.  Family History  Problem Relation Age of Onset   Alcohol abuse Other    Arthritis Other    Diabetes Other    Hypertension Other    Stroke Other      Current Outpatient Medications:    methylPREDNISolone (MEDROL DOSEPAK) 4 MG TBPK tablet, See admin instructions. follow package directions, Disp: , Rfl:    valACYclovir (VALTREX) 1000 MG tablet, Take 1 tablet (1,000 mg total) by mouth 3 (three) times daily for 7 days., Disp: 21 tablet, Rfl: 0  EXAM:  VITALS per patient if  applicable:  GENERAL: alert, oriented, appears well and in no acute distress  HEENT: atraumatic, conjunttiva clear, no obvious abnormalities on inspection of external nose and ears. His upper lip is tight and swollen. No erythema or lesions are seen.   NECK: normal movements of the head and neck  LUNGS: on inspection no signs of respiratory distress, breathing rate appears normal, no obvious gross SOB, gasping or wheezing  CV: no obvious cyanosis  MS: moves all visible extremities without noticeable abnormality  PSYCH/NEURO: pleasant and cooperative, no obvious depression or anxiety, speech and thought processing grossly intact  ASSESSMENT AND PLAN: Swollen upper lip. This resembles angioedema, and it is possible that the Covid infection has triggered it. However we would expect this to respond to steroids. I think he may be having a Herpes virus outbreak instead. We will treat this with 7 days of Valtrex 1000 mg TID. He will finish out the Medrol dose pack . Recheck as needed.  Gershon Crane, MD  Discussed the following assessment and plan:  No diagnosis found.     I discussed the assessment and treatment plan with the patient. The patient was provided an opportunity to ask questions and all were answered. The patient agreed with the plan and demonstrated an understanding of the instructions.   The patient was advised to call back or seek an in-person evaluation if the symptoms worsen or if  the condition fails to improve as anticipated.      Review of Systems     Objective:   Physical Exam        Assessment & Plan:

## 2021-08-08 ENCOUNTER — Telehealth: Payer: Self-pay | Admitting: Family Medicine

## 2021-08-08 ENCOUNTER — Other Ambulatory Visit: Payer: Self-pay

## 2021-08-08 MED ORDER — VALACYCLOVIR HCL 1 G PO TABS: ORAL_TABLET | ORAL | 0 refills | Status: DC

## 2021-08-08 NOTE — Telephone Encounter (Signed)
Call in #30 more pills

## 2021-08-08 NOTE — Telephone Encounter (Signed)
Last VV-08/01/21 Last refill- 08/01/21--21 tabs- 1 tab po TID for 7 days.  Please advise if okay for refill?

## 2021-08-08 NOTE — Telephone Encounter (Signed)
Patient called to get refill on valACYclovir (VALTREX) 1000 MG tablet     Please send to [id number] - [place], [place] - [address] AT [facility] [address] Phone:  [phone]  Fax:  [phone]          Please advise

## 2021-08-08 NOTE — Telephone Encounter (Signed)
Refill sent to [facility].  

## 2023-01-02 ENCOUNTER — Telehealth: Payer: Self-pay | Admitting: Family Medicine

## 2023-01-04 ENCOUNTER — Encounter: Payer: Self-pay | Admitting: Family Medicine

## 2023-01-04 ENCOUNTER — Ambulatory Visit (INDEPENDENT_AMBULATORY_CARE_PROVIDER_SITE_OTHER): Payer: Self-pay | Admitting: Family Medicine

## 2023-01-04 VITALS — BP 108/68 | HR 59 | Temp 98.4°F | Ht 72.0 in | Wt 161.2 lb

## 2023-01-04 DIAGNOSIS — A6000 Herpesviral infection of urogenital system, unspecified: Secondary | ICD-10-CM | POA: Insufficient documentation

## 2023-01-04 DIAGNOSIS — N6342 Unspecified lump in left breast, subareolar: Secondary | ICD-10-CM | POA: Insufficient documentation

## 2023-01-04 DIAGNOSIS — R300 Dysuria: Secondary | ICD-10-CM

## 2023-01-04 DIAGNOSIS — A6001 Herpesviral infection of penis: Secondary | ICD-10-CM

## 2023-01-04 LAB — POC URINALSYSI DIPSTICK (AUTOMATED)
Bilirubin, UA: NEGATIVE
Blood, UA: NEGATIVE
Glucose, UA: NEGATIVE
Ketones, UA: NEGATIVE
Leukocytes, UA: NEGATIVE
Nitrite, UA: NEGATIVE
Protein, UA: POSITIVE — AB
Spec Grav, UA: 1.015 (ref 1.010–1.025)
Urobilinogen, UA: 0.2 E.U./dL
pH, UA: 6.5 (ref 5.0–8.0)

## 2023-01-04 MED ORDER — VALACYCLOVIR HCL 1 G PO TABS: 1000.0000 mg | ORAL_TABLET | Freq: Two times a day (BID) | ORAL | 5 refills | Status: DC

## 2023-01-04 NOTE — Addendum Note (Signed)
Addended by: Carola Rhine on: 01/04/2023 03:24 PM   Modules accepted: Orders

## 2023-01-04 NOTE — Progress Notes (Signed)
   Subjective:    Patient ID: Jeffrey Levine, male    DOB: 10-26-1990, 32 y.o.   MRN: [id number]  HPI Here for several issues. First he had an outbreak of herpes with a single lesion on the penis about a week ago. At the same time he began to have mild burning with urination. No urgency or fever or testicular pain. No urethral DC. In addition about 2 months ago he noticed a lump in the left breast that has been slowly getting larger. This is not painful. No nipple DC. He had a similar though much smaller lump in the breast when he was a teenager, but this went away. He notes that he began taking some type of OTC herbal supplement about 3 months ago.    Review of Systems  Constitutional: Negative.   Respiratory: Negative.    Cardiovascular: Negative.   Gastrointestinal: Negative.   Genitourinary:  Positive for dysuria. Negative for flank pain, frequency, hematuria, penile discharge and urgency.  Skin:  Positive for rash.       Objective:   Physical Exam Constitutional:      Appearance: Normal appearance. He is not ill-appearing.  Cardiovascular:     Rate and Rhythm: Normal rate and regular rhythm.     Pulses: Normal pulses.     Heart sounds: Normal heart sounds.  Pulmonary:     Effort: Pulmonary effort is normal.     Breath sounds: Normal breath sounds.     Comments: There is a firm mobile non-tender lump behind the left nipple that is about 2 cm in diameter. The left axilla is clear  Genitourinary:    Comments: There is a single crusty lesion on the shaft of the penis (he says this began as a "blister" that then popped and drained clear fluid)  Neurological:     Mental Status: He is alert.           Assessment & Plan:  He has had another genital herpes outbreak. So he can use Valtrex as needed. I think the dysuria is the result of local inflammation in the penis, but a GC/Chlamydia screen is pending. For the breast lump, we will send him for a unilateral diagnostic  mammogram and an [place] to evaluate this. Conceivably the supplements he is taking could have triggered this, so I advised him to stop these.  Gershon Crane, MD

## 2023-01-05 LAB — C. TRACHOMATIS/N. GONORRHOEAE RNA
C. trachomatis RNA, TMA: NOT DETECTED
N. gonorrhoeae RNA, TMA: NOT DETECTED

## 2023-03-01 NOTE — Telephone Encounter (Signed)
error 

## 2024-03-09 ENCOUNTER — Other Ambulatory Visit: Payer: Self-pay | Admitting: Family Medicine

## 2024-03-10 ENCOUNTER — Other Ambulatory Visit: Payer: Self-pay | Admitting: Family Medicine

## 2024-03-10 NOTE — Telephone Encounter (Unsigned)
 Copied from CRM #[id number]. Topic: Clinical - Medication Refill >> Mar 10, 2024  9:17 AM Berneda FALCON wrote: Medication: valACYclovir  (VALTREX ) 1000 MG tablet  Pt states he recently moved to [place] and is completely out of this medication and needs refills on this please. It has been more than a year since his last appt but states he needs this refilled please.  Has the patient contacted their pharmacy? Yes (Agent: If no, request that the patient contact the pharmacy for the refill. If patient does not wish to contact the pharmacy document the reason why and proceed with request.) (Agent: If yes, when and what did the pharmacy advise?)  This is the patient's preferred pharmacy:   [id number] - [place], [place] - [address] AT [facility] OF [facility] [address] [address] [place]  [address] Phone: [phone] Fax: [phone]  Is this the correct pharmacy for this prescription? Yes If no, delete pharmacy and type the correct one.   Has the prescription been filled recently? No  Is the patient out of the medication? Yes  Has the patient been seen for an appointment in the last year OR does the patient have an upcoming appointment? No, but he states he needs this refilled please, he is completely out  Can we respond through MyChart? Yes  Agent: Please be advised that Rx refills may take up to 3 business days. We ask that you follow-up with your pharmacy.
